# Patient Record
Sex: Female | Born: 2000 | Race: Black or African American | Hispanic: No | Marital: Single | Smoking: Never smoker
Health system: Southern US, Community
[De-identification: ages and names within clinical notes are randomized; demographics above are authoritative.]

## PROBLEM LIST (undated history)

## (undated) DIAGNOSIS — E282 Polycystic ovarian syndrome: Secondary | ICD-10-CM

---
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
## 2018-12-24 IMAGING — US US ABDOMEN LIMITED
1 series · 14 of 25 positions shown · non-contrast
Comparison: None.

CLINICAL DATA: Right upper quadrant pain

EXAM:
ULTRASOUND ABDOMEN LIMITED RIGHT UPPER QUADRANT

[Series 1: us abdomen limited · 0.13mm/px · 14 of 41 slices shown]
[im 1/41]
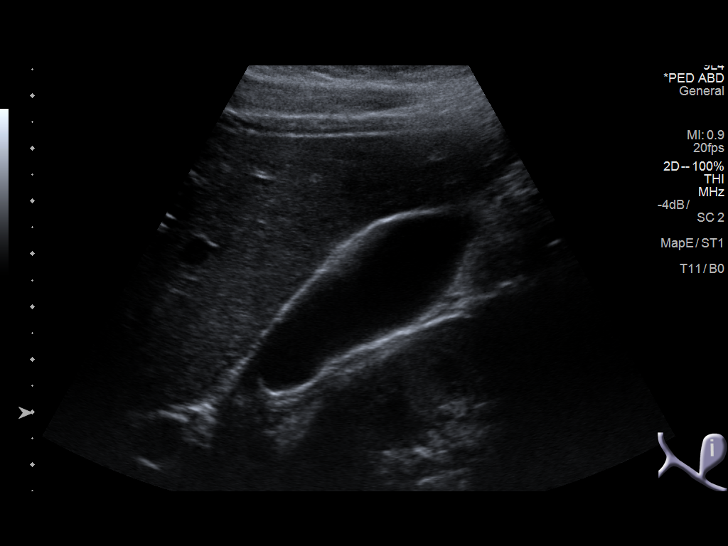
[im 4/41]
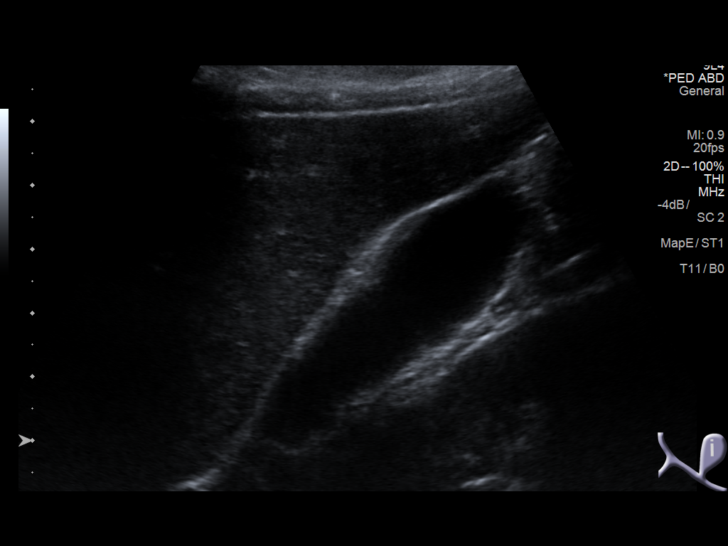
[im 7/41]
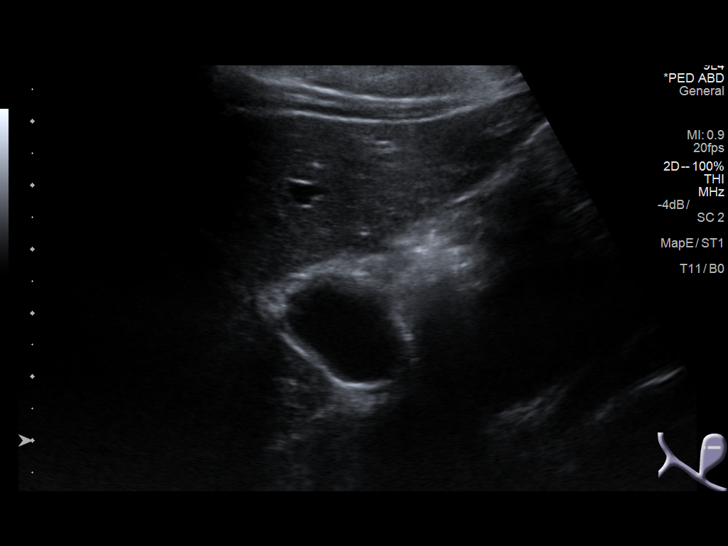
[im 11/41]
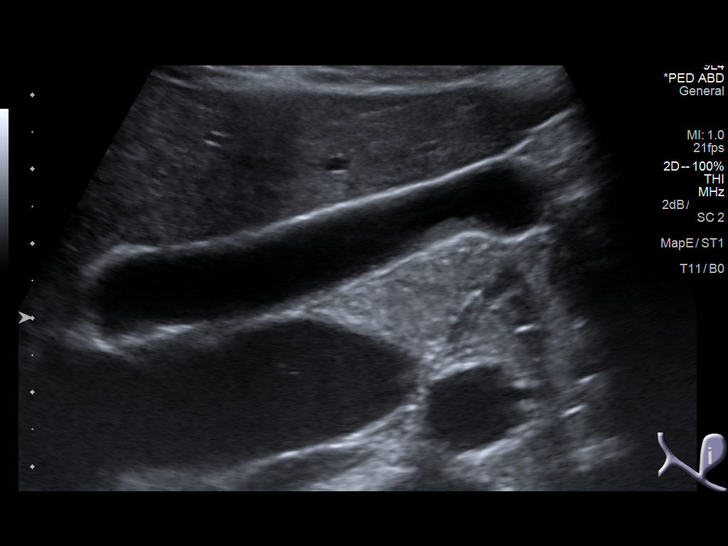
[im 14/41]
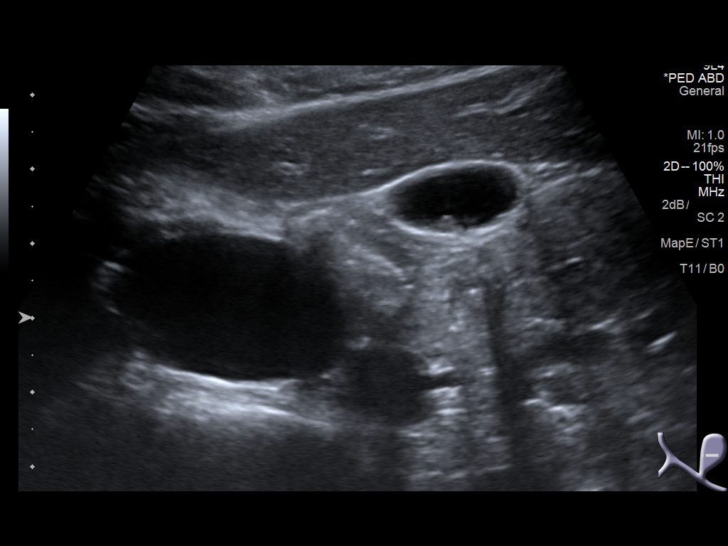
[im 16/41]
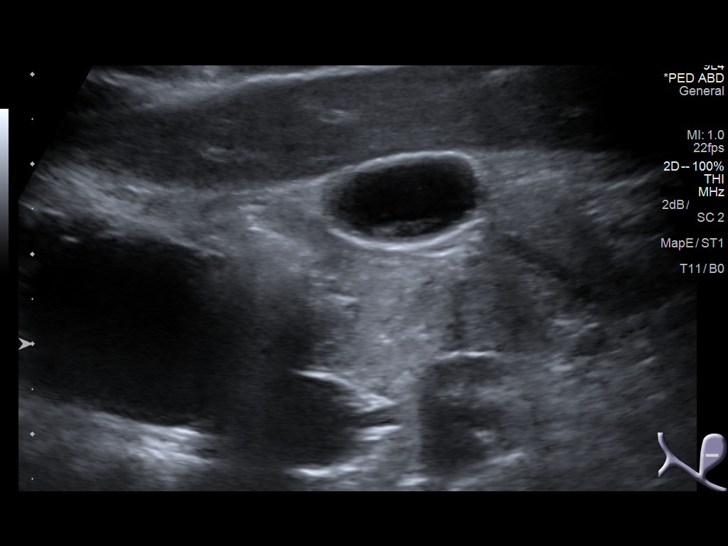
[im 19/41]
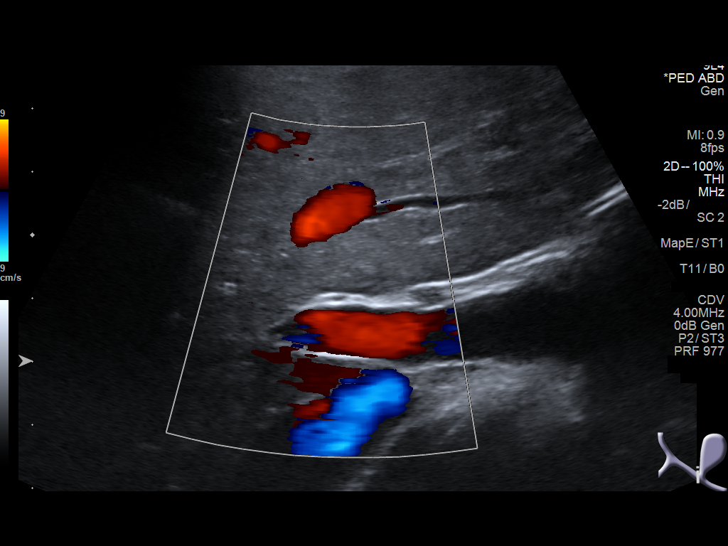
[im 22/41]
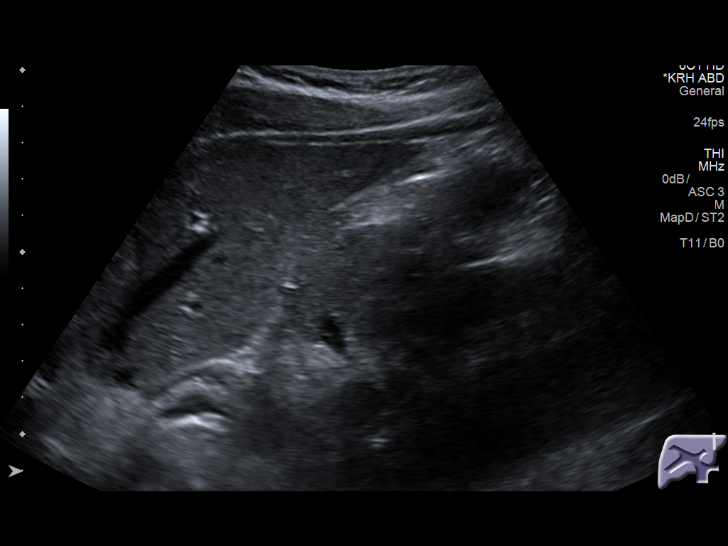
[im 26/41]
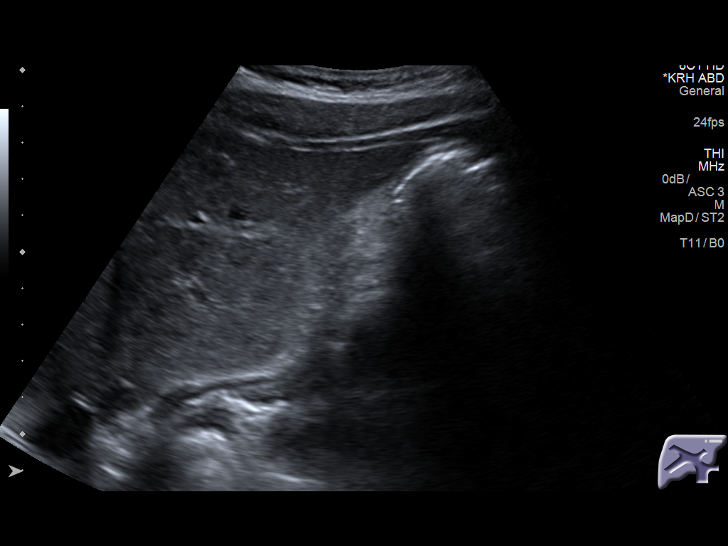
[im 27/41]
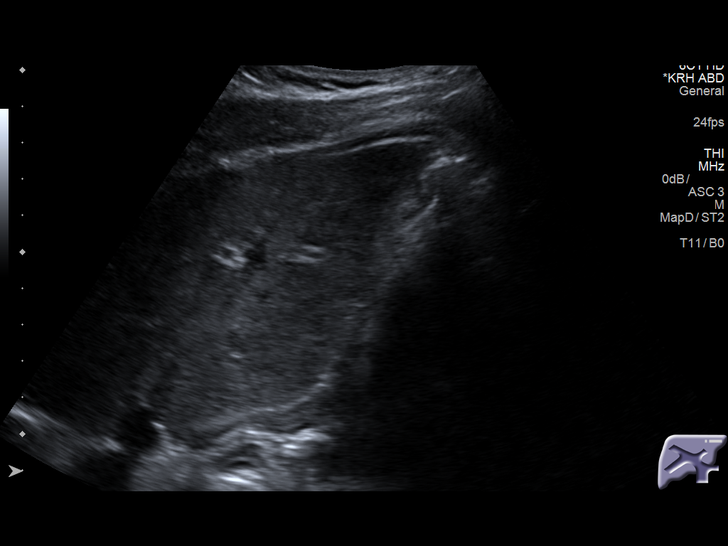
[im 31/41]
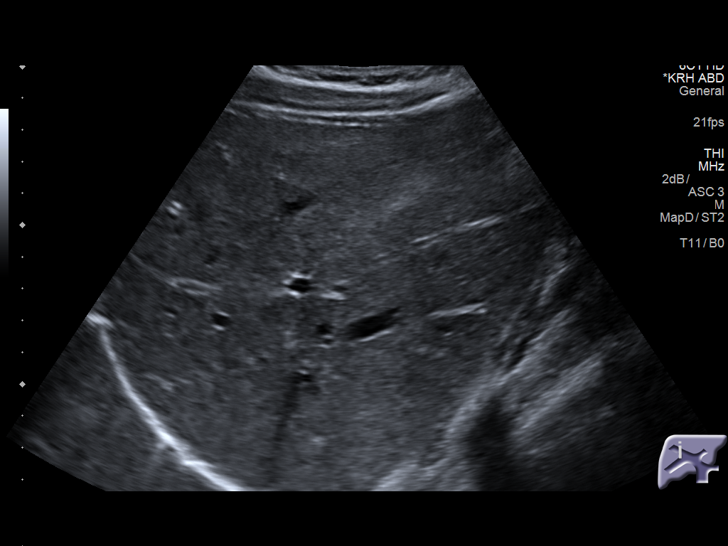
[im 34/41]
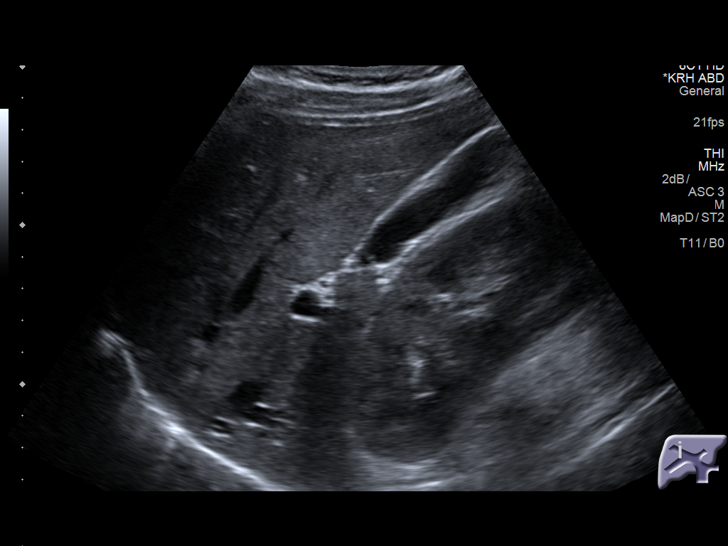
[im 37/41]
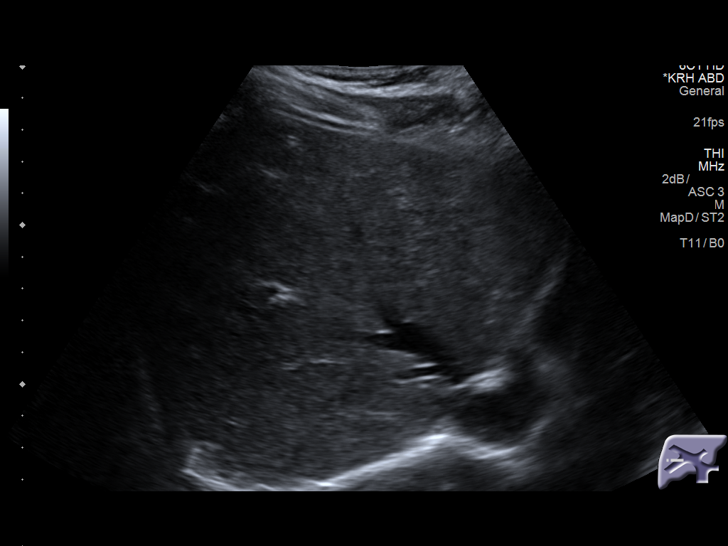
[im 41/41]
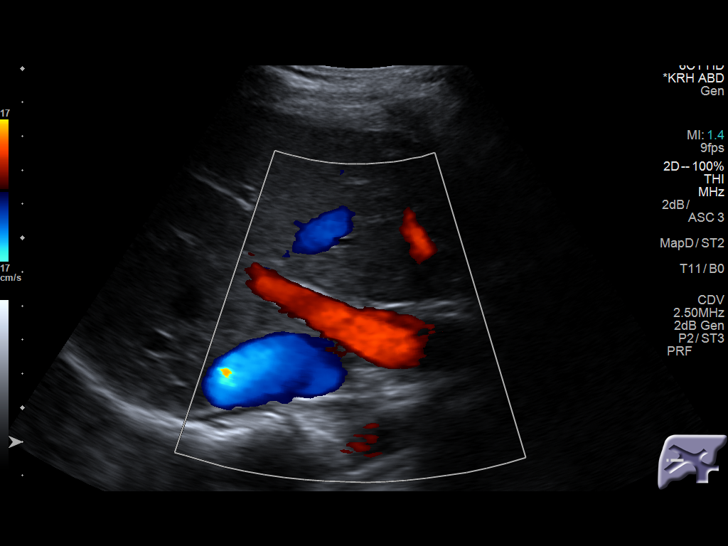

[14 of 25 positions shown; findings below may reference images not displayed]

FINDINGS: Gallbladder:

Small amount sludge in the gallbladder. No visible stones. There is
an area of focal wall thickening anteriorly measuring up to 3.7 mm.
Remainder of the gallbladder wall is normal. No pericholecystic
fluid.

Common bile duct:

Diameter: Normal caliber, 1 mm

Liver:

No focal lesion identified. Within normal limits in parenchymal
echogenicity.
IMPRESSION: Small amount of sludge in the gallbladder with small focal area of
slight gallbladder wall thickening. Remainder of the gallbladder
wall is normal. No visible stones.

## 2021-01-25 ENCOUNTER — Encounter (HOSPITAL_BASED_OUTPATIENT_CLINIC_OR_DEPARTMENT_OTHER): Payer: Self-pay | Admitting: *Deleted

## 2021-01-25 ENCOUNTER — Emergency Department (HOSPITAL_BASED_OUTPATIENT_CLINIC_OR_DEPARTMENT_OTHER)
Admission: EM | Admit: 2021-01-25 | Discharge: 2021-01-26 | Disposition: A | Discharge status: Home / Self Care | Payer: 59 | Attending: Emergency Medicine | Admitting: Emergency Medicine

## 2021-01-25 ENCOUNTER — Other Ambulatory Visit: Payer: Self-pay

## 2021-01-25 DIAGNOSIS — R1110 Vomiting, unspecified: Secondary | ICD-10-CM | POA: Diagnosis present

## 2021-01-25 DIAGNOSIS — F1099 Alcohol use, unspecified with unspecified alcohol-induced disorder: Secondary | ICD-10-CM | POA: Diagnosis not present

## 2021-01-25 DIAGNOSIS — E876 Hypokalemia: Secondary | ICD-10-CM

## 2021-01-25 DIAGNOSIS — R112 Nausea with vomiting, unspecified: Secondary | ICD-10-CM

## 2021-01-25 MED ORDER — ONDANSETRON HCL 4 MG/2ML IJ SOLN
4.0000 mg | Freq: Once | INTRAMUSCULAR | Status: AC
  Administered 2021-01-26: 4 mg via INTRAVENOUS
  Filled 2021-01-25: qty 2

## 2021-01-25 MED ORDER — SODIUM CHLORIDE 0.9 % IV BOLUS
500.0000 mL | Freq: Once | INTRAVENOUS | Status: AC
  Administered 2021-01-26: 500 mL via INTRAVENOUS

## 2021-01-25 NOTE — ED Triage Notes (Signed)
Pt reports n/v x 5 today. Denies diarrhea. States she did go to eat out yesterday and had some drinks

## 2021-01-26 ENCOUNTER — Encounter (HOSPITAL_BASED_OUTPATIENT_CLINIC_OR_DEPARTMENT_OTHER): Payer: Self-pay | Admitting: Emergency Medicine

## 2021-01-26 DIAGNOSIS — R1110 Vomiting, unspecified: Secondary | ICD-10-CM | POA: Diagnosis not present

## 2021-01-26 LAB — BASIC METABOLIC PANEL
Anion gap: 11 (ref 5–15)
BUN: 10 mg/dL (ref 6–20)
CO2: 22 mmol/L (ref 22–32)
Calcium: 9.2 mg/dL (ref 8.9–10.3)
Chloride: 105 mmol/L (ref 98–111)
Creatinine, Ser: 0.77 mg/dL (ref 0.44–1.00)
GFR, Estimated: >60 mL/min (ref >60)
Glucose, Bld: 92 mg/dL (ref 70–99)
Potassium: 3.1 mmol/L — ABNORMAL LOW (ref 3.5–5.1)
Sodium: 138 mmol/L (ref 135–145)

## 2021-01-26 LAB — PREGNANCY, URINE: Preg Test, Ur: NEGATIVE

## 2021-01-26 MED ORDER — ONDANSETRON 8 MG PO TBDP: ORAL_TABLET | ORAL | 0 refills | Status: DC

## 2021-01-26 MED ORDER — POTASSIUM CHLORIDE CRYS ER 20 MEQ PO TBCR
40.0000 meq | EXTENDED_RELEASE_TABLET | Freq: Once | ORAL | Status: AC
  Administered 2021-01-26: 40 meq via ORAL
  Filled 2021-01-26: qty 2

## 2021-01-26 NOTE — ED Provider Notes (Signed)
[facility] EMERGENCY DEPARTMENT Provider Note   CSN: [id number] Arrival date & time: 01/25/21  2313     History Chief Complaint  Patient presents with   Emesis    Candace Gonzales is a 20 y.o. female.  The history is provided by the patient.  Emesis Severity:  Mild Duration:  8 hours Timing:  Intermittent Quality:  Stomach contents Progression:  Unchanged Chronicity:  New Recent urination:  Normal Context: not post-tussive   Relieved by:  Nothing Worsened by:  Nothing Ineffective treatments:  None tried Associated symptoms: no abdominal pain, no arthralgias, no chills, no cough, no diarrhea, no fever, no headaches, no myalgias, no sore throat and no URI   Risk factors: alcohol use   Risk factors: no prior abdominal surgery       History reviewed. No pertinent past medical history.  There are no problems to display for this patient.   History reviewed. No pertinent surgical history.   OB History   No obstetric history on file.     History reviewed. No pertinent family history.  Social History   Tobacco Use   Smoking status: Never   Smokeless tobacco: Never  Vaping Use   Vaping Use: Never used  Substance Use Topics   Alcohol use: Yes   Drug use: No    Home Medications Prior to Admission medications   Medication Sig Start Date End Date Taking? Authorizing Provider  amoxicillin-clavulanate (AUGMENTIN) 875-125 MG tablet Take 1 tablet by mouth every 12 (twelve) hours. 04/15/17   Mackuen, Courteney Lyn, MD  cephALEXin (KEFLEX) 500 MG capsule Take 1 capsule (500 mg total) by mouth 2 (two) times daily. 07/22/17   Maczis, Elmer Sow, PA-C  hydrocortisone valerate cream (WESTCORT) 0.2 % Apply 1 application topically 2 (two) times daily as needed. For eczema    [provider]  metroNIDAZOLE (FLAGYL) 500 MG tablet Take 1 tablet (500 mg total) by mouth 2 (two) times daily. 07/22/17   Maczis, Elmer Sow, PA-C  phenazopyridine (PYRIDIUM) 95 MG tablet  Take 1 tablet (95 mg total) by mouth 3 (three) times daily as needed for pain. 07/22/17   Maczis, Elmer Sow, PA-C  UNKNOWN TO PATIENT     [provider]    Allergies    Patient has no known allergies.  Review of Systems   Review of Systems  Constitutional:  Negative for chills and fever.  HENT:  Negative for sore throat.   Eyes:  Negative for redness.  Respiratory:  Negative for cough.   Gastrointestinal:  Positive for vomiting. Negative for abdominal pain and diarrhea.  Genitourinary:  Negative for difficulty urinating.  Musculoskeletal:  Negative for arthralgias and myalgias.  Skin:  Negative for rash.  Neurological:  Negative for headaches.  Psychiatric/Behavioral:  Negative for agitation.   All other systems reviewed and are negative.  Physical Exam Updated Vital Signs BP 120/82 (BP Location: Right Arm)   Pulse 70   Temp 98.9 F (37.2 C) (Oral)   Resp 16   Ht 5' (1.524 m)   Wt 65.8 kg   LMP 12/26/2020   SpO2 99%   BMI 28.32 kg/m   Physical Exam Vitals and nursing note reviewed.  Constitutional:      General: She is not in acute distress.    Appearance: Normal appearance.  HENT:     Head: Normocephalic and atraumatic.     Nose: Nose normal.  Eyes:     Conjunctiva/sclera: Conjunctivae normal.     Pupils: Pupils  are equal, round, and reactive to light.  Cardiovascular:     Rate and Rhythm: Normal rate and regular rhythm.     Pulses: Normal pulses.     Heart sounds: Normal heart sounds.  Pulmonary:     Effort: Pulmonary effort is normal.     Breath sounds: Normal breath sounds.  Abdominal:     General: Abdomen is flat. Bowel sounds are normal.     Palpations: Abdomen is soft.     Tenderness: There is no abdominal tenderness. There is no guarding or rebound.  Musculoskeletal:        General: Normal range of motion.     Cervical back: Normal range of motion and neck supple.  Skin:    General: Skin is warm and dry.     Capillary Refill: Capillary  refill takes less than 2 seconds.  Neurological:     General: No focal deficit present.     Mental Status: She is alert and oriented to person, place, and time.     Deep Tendon Reflexes: Reflexes normal.  Psychiatric:        Mood and Affect: Mood normal.        Behavior: Behavior normal.    ED Results / Procedures / Treatments   Labs (all labs ordered are listed, but only abnormal results are displayed) Results for orders placed or performed during the hospital encounter of 01/25/21  Pregnancy, urine  Result Value Ref Range   Preg Test, Ur NEGATIVE NEGATIVE  Basic metabolic panel  Result Value Ref Range   Sodium 138 135 - 145 mmol/L   Potassium 3.1 (L) 3.5 - 5.1 mmol/L   Chloride 105 98 - 111 mmol/L   CO2 22 22 - 32 mmol/L   Glucose, Bld 92 70 - 99 mg/dL   BUN 10 6 - 20 mg/dL   Creatinine, Ser 0.92 0.44 - 1.00 mg/dL   Calcium 9.2 8.9 - 33.0 mg/dL   GFR, Estimated >07 >62 mL/min   Anion gap 11 5 - 15   No results found.   Radiology No results found.  Procedures Procedures   Medications Ordered in ED Medications  ondansetron (ZOFRAN) injection 4 mg (4 mg Intravenous Given 01/26/21 0059)  sodium chloride 0.9 % bolus 500 mL (500 mLs Intravenous New Bag/Given 01/26/21 0101)  potassium chloride SA (KLOR-CON) CR tablet 40 mEq (40 mEq Oral Given 01/26/21 0130)    ED Course  I have reviewed the triage vital signs and the nursing notes.  Pertinent labs & imaging results that were available during my care of the patient were reviewed by me and considered in my medical decision making (see chart for details).  Vomiting post ETOH ingestion.  Do not drink alcohol.  PO challenged successfully.  Stable for discharge with close follow up.  I do not see signs of surgical abdomen.   Aranda Bihm was evaluated in Emergency Department on 01/26/2021 for the symptoms described in the history of present illness. She was evaluated in the context of the global COVID-19 pandemic, which necessitated  consideration that the patient might be at risk for infection with the SARS-CoV-2 virus that causes COVID-19. Institutional protocols and algorithms that pertain to the evaluation of patients at risk for COVID-19 are in a state of rapid change based on information released by regulatory bodies including the CDC and federal and state organizations. These policies and algorithms were followed during the patient's care in the ED.  Final Clinical Impression(s) / ED Diagnoses Final diagnoses:  None  Return for intractable cough, coughing up blood, fevers > 100.4 unrelieved by medication, shortness of breath, intractable vomiting, chest pain, shortness of breath, weakness, numbness, changes in speech, facial asymmetry, abdominal pain, passing out, Inability to tolerate liquids or food, cough, altered mental status or any concerns. No signs of systemic illness or infection. The patient is nontoxic-appearing on exam and vital signs are within normal limits. I have reviewed the triage vital signs and the nursing notes. Pertinent labs & imaging results that were available during my care of the patient were reviewed by me and considered in my medical decision making (see chart for details). After history, exam, and medical workup I feel the patient has been appropriately medically screened and is safe for discharge home. Pertinent diagnoses were discussed with the patient. Patient was given return precautions.  Rx / DC Orders ED Discharge Orders     None        Oneta Sigman, MD 01/26/21 [phone]

## 2021-01-26 NOTE — ED Notes (Signed)
Water was given to the Pt.  

## 2021-03-03 ENCOUNTER — Emergency Department (HOSPITAL_BASED_OUTPATIENT_CLINIC_OR_DEPARTMENT_OTHER)
Admission: EM | Admit: 2021-03-03 | Discharge: 2021-03-03 | Disposition: A | Discharge status: Home / Self Care | Payer: 59 | Attending: Student | Admitting: Student

## 2021-03-03 ENCOUNTER — Other Ambulatory Visit: Payer: Self-pay

## 2021-03-03 ENCOUNTER — Encounter (HOSPITAL_BASED_OUTPATIENT_CLINIC_OR_DEPARTMENT_OTHER): Payer: Self-pay

## 2021-03-03 DIAGNOSIS — J069 Acute upper respiratory infection, unspecified: Secondary | ICD-10-CM | POA: Diagnosis not present

## 2021-03-03 DIAGNOSIS — Z20822 Contact with and (suspected) exposure to covid-19: Secondary | ICD-10-CM | POA: Diagnosis not present

## 2021-03-03 DIAGNOSIS — J029 Acute pharyngitis, unspecified: Secondary | ICD-10-CM | POA: Diagnosis present

## 2021-03-03 LAB — RESP PANEL BY RT-PCR (FLU A&B, COVID) ARPGX2
Influenza A by PCR: NEGATIVE
Influenza B by PCR: NEGATIVE
SARS Coronavirus 2 by RT PCR: NEGATIVE

## 2021-03-03 LAB — GROUP A STREP BY PCR: Group A Strep by PCR: NOT DETECTED

## 2021-03-03 NOTE — ED Provider Notes (Signed)
[facility] EMERGENCY DEPARTMENT Provider Note   CSN: [id number] Arrival date & time: 03/03/21  1104     History Chief Complaint  Patient presents with   URI    Candace Gonzales is a 20 y.o. female with no past medical history who presented today after waking up with bilateral swollen lymph nodes and a sore throat.  Patient has not been sick recently.  No known sick contacts.  No fever, chills, chest pain or difficulty breathing.  No complaints of ear pain.  No cough, congestion or runny nose.  Has not tested herself for COVID.   URI Presenting symptoms: sore throat   Presenting symptoms: no cough, no ear pain, no fatigue and no fever   Associated symptoms: no sinus pain       History reviewed. No pertinent past medical history.  There are no problems to display for this patient.   History reviewed. No pertinent surgical history.   OB History   No obstetric history on file.     No family history on file.  Social History   Tobacco Use   Smoking status: Never   Smokeless tobacco: Never  Vaping Use   Vaping Use: Never used  Substance Use Topics   Alcohol use: Yes    Comment: occ   Drug use: No    Home Medications Prior to Admission medications   Medication Sig Start Date End Date Taking? Authorizing Provider  amoxicillin-clavulanate (AUGMENTIN) 875-125 MG tablet Take 1 tablet by mouth every 12 (twelve) hours. 04/15/17   Mackuen, Courteney Lyn, MD  cephALEXin (KEFLEX) 500 MG capsule Take 1 capsule (500 mg total) by mouth 2 (two) times daily. 07/22/17   Maczis, Elmer Sow, PA-C  hydrocortisone valerate cream (WESTCORT) 0.2 % Apply 1 application topically 2 (two) times daily as needed. For eczema    [provider]  metroNIDAZOLE (FLAGYL) 500 MG tablet Take 1 tablet (500 mg total) by mouth 2 (two) times daily. 07/22/17   Maczis, Elmer Sow, PA-C  ondansetron (ZOFRAN ODT) 8 MG disintegrating tablet 8mg  ODT q8 hours prn nausea 01/26/21   Palumbo, April, MD   phenazopyridine (PYRIDIUM) 95 MG tablet Take 1 tablet (95 mg total) by mouth 3 (three) times daily as needed for pain. 07/22/17   Maczis, 07/24/17, PA-C  UNKNOWN TO PATIENT     [provider]    Allergies    Patient has no known allergies.  Review of Systems   Review of Systems  Constitutional:  Negative for chills, fatigue and fever.  HENT:  Positive for sore throat. Negative for ear discharge, ear pain and sinus pain.   Respiratory:  Negative for cough and shortness of breath.   Cardiovascular:  Negative for chest pain and palpitations.  Gastrointestinal:  Negative for diarrhea.  Skin:  Negative for rash.  All other systems reviewed and are negative.  Physical Exam Updated Vital Signs BP 123/84 (BP Location: Right Arm)   Pulse 98   Temp 98.6 F (37 C) (Oral)   Resp 16   Ht 5' (1.524 m)   Wt 65.1 kg   LMP 02/01/2021   SpO2 100%   BMI 28.03 kg/m   Physical Exam Vitals and nursing note reviewed.  Constitutional:      Appearance: Normal appearance.  HENT:     Head: Normocephalic and atraumatic.     Right Ear: Tympanic membrane, ear canal and external ear normal.     Left Ear: Tympanic membrane, ear canal and external ear  normal.     Nose: Nose normal.     Mouth/Throat:     Mouth: Mucous membranes are moist.     Pharynx: Oropharynx is clear. No oropharyngeal exudate or posterior oropharyngeal erythema.     Comments: 2+ tonsillar swelling Eyes:     General: No scleral icterus.    Conjunctiva/sclera: Conjunctivae normal.     Pupils: Pupils are equal, round, and reactive to light.  Cardiovascular:     Rate and Rhythm: Normal rate and regular rhythm.  Pulmonary:     Effort: Pulmonary effort is normal. No respiratory distress.     Breath sounds: Normal breath sounds. No wheezing or rales.  Musculoskeletal:     Cervical back: Normal range of motion.  Lymphadenopathy:     Cervical: No cervical adenopathy.  Skin:    General: Skin is warm and dry.      Findings: No rash.  Neurological:     Mental Status: She is alert.  Psychiatric:        Mood and Affect: Mood normal.    ED Results / Procedures / Treatments   Labs (all labs ordered are listed, but only abnormal results are displayed) Labs Reviewed  RESP PANEL BY RT-PCR (FLU A&B, COVID) ARPGX2  GROUP A STREP BY PCR    EKG None  Radiology No results found.  Procedures Procedures   Medications Ordered in ED Medications - No data to display  ED Course  I have reviewed the triage vital signs and the nursing notes.  Pertinent labs & imaging results that were available during my care of the patient were reviewed by me and considered in my medical decision making (see chart for details).    MDM Rules/Calculators/A&P   Priscille Heidelberg was evaluated by me and fast-track.  She did not have any lymphadenopathy or evidence of throat infection, PTA or retropharyngeal abscess.  Strep throat was negative.  COVID-19 and flu were also negative.  I suspect her symptoms to be due to a virus.  These symptoms only began this morning and she does not need treatment in our department at this time.  She may treat her illness with over-the-counter medication and follow-up if symptoms continue for over a 10 days.  Final Clinical Impression(s) / ED Diagnoses Final diagnoses:  Viral upper respiratory tract infection    Rx / DC Orders Results and diagnoses were explained to the patient. Return precautions discussed in full. Patient had no additional questions and expressed complete understanding.     Saddie Benders, PA-C 03/03/21 1306    Glendora Score, MD 03/03/21 [phone]

## 2021-03-03 NOTE — Discharge Instructions (Addendum)
Your strep throat, COVID and flu testing is negative today. Because your symptoms started this morning I believe you need to give this illness more time.  You may utilize over-the-counter remedies for your symptoms.  Follow-up with your primary care provider if symptoms are still present for the next 10 days.  It was a pleasure to meet you and I hope that you feel better.

## 2021-03-03 NOTE — ED Triage Notes (Signed)
Pt c/o URI sx x today-NAD-steady gait

## 2021-05-26 ENCOUNTER — Emergency Department (INDEPENDENT_AMBULATORY_CARE_PROVIDER_SITE_OTHER)
Admission: EM | Admit: 2021-05-26 | Discharge: 2021-05-26 | Disposition: A | Discharge status: Home / Self Care | Payer: BLUE CROSS/BLUE SHIELD | Source: Home / Self Care | Attending: Family Medicine | Admitting: Family Medicine

## 2021-05-26 ENCOUNTER — Other Ambulatory Visit: Payer: Self-pay

## 2021-05-26 DIAGNOSIS — U071 COVID-19: Secondary | ICD-10-CM | POA: Diagnosis not present

## 2021-05-26 DIAGNOSIS — J029 Acute pharyngitis, unspecified: Secondary | ICD-10-CM

## 2021-05-26 LAB — POCT INFLUENZA A/B
Influenza A, POC: NEGATIVE
Influenza B, POC: NEGATIVE

## 2021-05-26 LAB — POCT RAPID STREP A (OFFICE): Rapid Strep A Screen: NEGATIVE

## 2021-05-26 LAB — POC SARS CORONAVIRUS 2 AG -  ED: SARS Coronavirus 2 Ag: POSITIVE — AB

## 2021-05-26 NOTE — ED Provider Notes (Signed)
[facility]    CSN: [id number] Arrival date & time: 05/26/21  1642      History   Chief Complaint Chief Complaint  Patient presents with   Sore Throat   Nasal Congestion    HPI Candace Gonzales is a 21 y.o. female.   HPI  Patient works at one of the [employer].  She called health at work today because she developed sore throat, cough, body aches and fatigue.  Symptoms started late yesterday and are worse today.  She states she has taken Tylenol.  Her temperature is 99.7.  Health at work told her to come here for viral testing prior to work tomorrow.  No known exposure to illness  History reviewed. No pertinent past medical history.  There are no problems to display for this patient.   History reviewed. No pertinent surgical history.  OB History   No obstetric history on file.      Home Medications    Prior to Admission medications   Not on File    Family History History reviewed. No pertinent family history.  Social History Social History   Tobacco Use   Smoking status: Never   Smokeless tobacco: Never  Vaping Use   Vaping Use: Never used  Substance Use Topics   Alcohol use: Yes    Comment: occ   Drug use: No     Allergies   Patient has no known allergies.   Review of Systems Review of Systems See HPI  Physical Exam Triage Vital Signs ED Triage Vitals  Enc Vitals Group     BP 05/26/21 1727 121/86     Pulse Rate 05/26/21 1727 95     Resp 05/26/21 1727 14     Temp 05/26/21 1727 99.7 F (37.6 C)     Temp src --      SpO2 05/26/21 1727 99 %     Weight --      Height --      Head Circumference --      Peak Flow --      Pain Score 05/26/21 1728 0     Pain Loc --      Pain Edu? --      Excl. in GC? --    No data found.  Updated Vital Signs BP 121/86 (BP Location: Left Arm)    Pulse 95    Temp 99.7 F (37.6 C)    Resp 14    LMP 05/14/2021 (Exact Date)    SpO2 99%      Physical Exam   UC Treatments /  Results  Labs (all labs ordered are listed, but only abnormal results are displayed) Labs Reviewed  POC SARS CORONAVIRUS 2 AG -  ED - Abnormal; Notable for the following components:      Result Value   SARS Coronavirus 2 Ag Positive (*)    All other components within normal limits  POCT INFLUENZA A/B  POCT RAPID STREP A (OFFICE)    EKG   Radiology No results found.  Procedures Procedures (including critical care time)  Medications Ordered in UC Medications - No data to display  Initial Impression / Assessment and Plan / UC Course  I have reviewed the triage vital signs and the nursing notes.  Pertinent labs & imaging results that were available during my care of the patient were reviewed by me and considered in my medical decision making (see chart for details).     COVID test is  positive Final Clinical Impressions(s) / UC Diagnoses   Final diagnoses:  Sore throat  COVID-19     Discharge Instructions      May take Tylenol or ibuprofen for pain and fever May take over-the-counter cough and cold medications You must quarantine at home for 5 days Check with health at work for return to work status Call for problems     ED Prescriptions   None    PDMP not reviewed this encounter.   Eustace Moore, MD 05/26/21 [phone]

## 2021-05-26 NOTE — Discharge Instructions (Addendum)
May take Tylenol or ibuprofen for pain and fever May take over-the-counter cough and cold medications You must quarantine at home for 5 days Check with health at work for return to work status Call for problems

## 2021-05-26 NOTE — ED Triage Notes (Signed)
Pt presents with nasal congestion and sore throat. Pt denies any cough, body aches, or fever. Sx began yesterday.

## 2021-09-16 ENCOUNTER — Emergency Department (INDEPENDENT_AMBULATORY_CARE_PROVIDER_SITE_OTHER)
Admission: EM | Admit: 2021-09-16 | Discharge: 2021-09-16 | Disposition: A | Discharge status: Home / Self Care | Payer: BLUE CROSS/BLUE SHIELD | Source: Home / Self Care | Attending: Family Medicine | Admitting: Family Medicine

## 2021-09-16 ENCOUNTER — Inpatient Hospital Stay (HOSPITAL_COMMUNITY): Admit: 2021-09-16 | Payer: BLUE CROSS/BLUE SHIELD

## 2021-09-16 DIAGNOSIS — R350 Frequency of micturition: Secondary | ICD-10-CM | POA: Diagnosis not present

## 2021-09-16 DIAGNOSIS — N898 Other specified noninflammatory disorders of vagina: Secondary | ICD-10-CM | POA: Diagnosis not present

## 2021-09-16 DIAGNOSIS — R110 Nausea: Secondary | ICD-10-CM | POA: Diagnosis not present

## 2021-09-16 LAB — POCT URINALYSIS DIP (MANUAL ENTRY)
Glucose, UA: NEGATIVE mg/dL
Nitrite, UA: NEGATIVE
Protein Ur, POC: 30 mg/dL — AB
Spec Grav, UA: 1.02 (ref 1.010–1.025)
Urobilinogen, UA: 1 E.U./dL
pH, UA: 6.5 (ref 5.0–8.0)

## 2021-09-16 LAB — POC SARS CORONAVIRUS 2 AG -  ED: SARS Coronavirus 2 Ag: NEGATIVE

## 2021-09-16 LAB — POCT URINE PREGNANCY: Preg Test, Ur: NEGATIVE

## 2021-09-16 MED ORDER — ONDANSETRON 4 MG PO TBDP: 4.0000 mg | ORAL_TABLET | Freq: Three times a day (TID) | ORAL | 0 refills | Status: DC | PRN

## 2021-09-16 MED ORDER — ONDANSETRON 4 MG PO TBDP
4.0000 mg | ORAL_TABLET | Freq: Once | ORAL | Status: AC
  Administered 2021-09-16: 4 mg via ORAL

## 2021-09-16 MED ORDER — NITROFURANTOIN MONOHYD MACRO 100 MG PO CAPS: ORAL_CAPSULE | ORAL | 0 refills | Status: DC

## 2021-09-16 NOTE — Discharge Instructions (Addendum)
Increase fluid intake. ? ?If symptoms become significantly worse during the night or over the weekend, proceed to the local emergency room.  ?Avoid all sexual contact until your symptoms have cleared and your tests are negative (or treated if positive). ?

## 2021-09-16 NOTE — ED Triage Notes (Signed)
Pt c/o nausea and headache since Sunday. Taking OTC headache meds prn. Also c/o vaginal discharge that started yesterday. Hx of BV. ?

## 2021-09-16 NOTE — ED Provider Notes (Signed)
?[facility] ? ? ? ?CSN: [id number] ?Arrival date & time: 09/16/21  1314 ? ? ?  ? ?History   ?Chief Complaint ?Chief Complaint  ?Patient presents with  ? Vaginal Discharge  ? Nausea  ? Headache  ? ? ?HPI ?Candace Gonzales is a 21 y.o. female.  ? ?Two days ago patient developed headache, nausea (without vomiting) and diarrhea (now resolved).  Yesterday she noticed abdominal bloating, and developed urinary frequency and clear vaginal discharge.  She denies pelvic pain and notes that she has a history of recurring BV.  Patient's last menstrual period was 09/06/2021 (exact date).  ? ?The history is provided by the patient.  ? ?History reviewed. No pertinent past medical history. ? ?There are no problems to display for this patient. ? ? ?History reviewed. No pertinent surgical history. ? ?OB History   ?No obstetric history on file. ?  ? ? ? ?Home Medications   ? ?Prior to Admission medications   ?Medication Sig Start Date End Date Taking? Authorizing Provider  ?escitalopram (LEXAPRO) 10 MG tablet Take 1 tablet by mouth daily. 08/05/21  Yes [provider]  ?nitrofurantoin, macrocrystal-monohydrate, (MACROBID) 100 MG capsule Take one cap PO Q12hr with food. 09/16/21  Yes Lattie Haw, MD  ?ondansetron (ZOFRAN-ODT) 4 MG disintegrating tablet Take 1 tablet (4 mg total) by mouth every 8 (eight) hours as needed for nausea or vomiting. Dissolve under tongue. 09/16/21  Yes Lattie Haw, MD  ?norgestimate-ethinyl estradiol (ORTHO-CYCLEN) 0.25-35 MG-MCG tablet Take 1 tablet by mouth daily. 08/29/21   [provider]  ? ? ?Family History ?History reviewed. No pertinent family history. ? ?Social History ?Social History  ? ?Tobacco Use  ? Smoking status: Never  ? Smokeless tobacco: Never  ?Vaping Use  ? Vaping Use: Never used  ?Substance Use Topics  ? Alcohol use: Yes  ?  Comment: occ  ? Drug use: No  ? ? ? ?Allergies   ?Patient has no known allergies. ? ? ?Review of Systems ?Review of Systems   ?Constitutional:  Positive for activity change, appetite change and fatigue. Negative for chills, diaphoresis and fever.  ?HENT: Negative.    ?Eyes: Negative.   ?Respiratory: Negative.    ?Cardiovascular: Negative.   ?Gastrointestinal:  Positive for diarrhea and nausea. Negative for abdominal distention, abdominal pain, blood in stool, constipation and vomiting.  ?Genitourinary:  Positive for frequency and vaginal discharge. Negative for dysuria, flank pain, genital sores, hematuria, menstrual problem, pelvic pain, urgency, vaginal bleeding and vaginal pain.  ?Musculoskeletal: Negative.   ?Skin: Negative.   ?Neurological:  Positive for headaches.  ?Hematological:  Negative for adenopathy.  ? ? ?Physical Exam ?Triage Vital Signs ?ED Triage Vitals  ?Enc Vitals Group  ?   BP 09/16/21 1344 122/79  ?   Pulse Rate 09/16/21 1344 (!) 107  ?   Resp 09/16/21 1344 18  ?   Temp 09/16/21 1344 (!) 100.5 ?F (38.1 ?C)  ?   Temp Source 09/16/21 1344 Oral  ?   SpO2 09/16/21 1344 98 %  ?   Weight --   ?   Height --   ?   Head Circumference --   ?   Peak Flow --   ?   Pain Score 09/16/21 1345 0  ?   Pain Loc --   ?   Pain Edu? --   ?   Excl. in GC? --   ? ?No data found. ? ?Updated Vital Signs ?BP 122/79 (BP Location: Right Arm)  Pulse (!) 107   Temp (!) 100.5 ?F (38.1 ?C) (Oral)   Resp 18   LMP 09/06/2021 (Exact Date)   SpO2 98%  ? ?Visual Acuity ?Right Eye Distance:   ?Left Eye Distance:   ?Bilateral Distance:   ? ?Right Eye Near:   ?Left Eye Near:    ?Bilateral Near:    ? ?Physical Exam ?Nursing notes and Vital Signs reviewed. ?Appearance:  Patient appears stated age, and in no acute distress ?Eyes:  Pupils are equal, round, and reactive to light and accomodation.  Extraocular movement is intact.  Conjunctivae are not inflamed  ?Ears:  Canals normal.  Tympanic membranes normal.  ?Nose:  Normal turbinates.  No sinus tenderness.   ?Pharynx:  Normal; moist mucous membranes  ?Neck:  Supple.  No adenopathy ?Lungs:  Clear to  auscultation.  Breath sounds are equal.  Moving air well. ?Heart:  Regular rate and rhythm without murmurs, rubs, or gallops.  Tachycardia 107. ?Abdomen:  Nontender without masses or hepatosplenomegaly.  Bowel sounds are present.  No CVA or flank tenderness.  ?Extremities:  No edema.  ?Pelvic exam:  deferred (patient instructed in self-collecting vaginal specimen) ?Skin:  No rash present. ? ? ?UC Treatments / Results  ?Labs ?(all labs ordered are listed, but only abnormal results are displayed) ?Labs Reviewed  ?POCT URINALYSIS DIP (MANUAL ENTRY) - Abnormal; Notable for the following components:  ?    Result Value  ? Bilirubin, UA small (*)   ? Ketones, POC UA large (80) (*)   ? Blood, UA small (*)   ? Protein Ur, POC =30 (*)   ? Leukocytes, UA Trace (*)   ? All other components within normal limits  ?URINE CULTURE  ?POC SARS CORONAVIRUS 2 AG -  ED  ?POCT URINE PREGNANCY  ? ? ?EKG ? ? ?Radiology ?No results found. ? ?Procedures ?Procedures (including critical care time) ? ?Medications Ordered in UC ?Medications  ?ondansetron (ZOFRAN-ODT) disintegrating tablet 4 mg (4 mg Oral Given 09/16/21 1359)  ? ? ?Initial Impression / Assessment and Plan / UC Course  ?I have reviewed the triage vital signs and the nursing notes. ? ?Pertinent labs & imaging results that were available during my care of the patient were reviewed by me and considered in my medical decision making (see chart for details). ? ?  ?Tests pending as above. ?Begin empiric Macrobid.  Administered Zofran ODT 4mg  PO. Rx for Zofran ODT 4mg . ?Followup with Family Doctor if not improved in one week.  ? ?Final Clinical Impressions(s) / UC Diagnoses  ? ?Final diagnoses:  ?Increased urinary frequency  ?Vaginal discharge  ?Nausea without vomiting  ? ? ? ?Discharge Instructions   ? ?  ?Increase fluid intake. ? ?If symptoms become significantly worse during the night or over the weekend, proceed to the local emergency room.  ?Avoid all sexual contact until your  symptoms have cleared and your tests are negative (or treated if positive). ? ? ? ?ED Prescriptions   ? ? Medication Sig Dispense Auth. Provider  ? nitrofurantoin, macrocrystal-monohydrate, (MACROBID) 100 MG capsule Take one cap PO Q12hr with food. 14 capsule , MD  ? ondansetron (ZOFRAN-ODT) 4 MG disintegrating tablet Take 1 tablet (4 mg total) by mouth every 8 (eight) hours as needed for nausea or vomiting. Dissolve under tongue. 12 tablet , MD  ? ?  ? ? ?  ?Lattie Haw, MD ?09/18/21 1045 ? ?

## 2021-09-17 LAB — CERVICOVAGINAL ANCILLARY ONLY
Bacterial Vaginitis (gardnerella): NEGATIVE
Candida Glabrata: NEGATIVE
Candida Vaginitis: NEGATIVE
Chlamydia: NEGATIVE
Comment: NEGATIVE
Comment: NEGATIVE
Comment: NEGATIVE
Comment: NEGATIVE
Comment: NEGATIVE
Comment: NORMAL
Neisseria Gonorrhea: NEGATIVE
Trichomonas: NEGATIVE

## 2021-09-18 LAB — URINE CULTURE
MICRO NUMBER:: 13313111
Result:: NO GROWTH
SPECIMEN QUALITY:: ADEQUATE

## 2022-12-17 ENCOUNTER — Ambulatory Visit: Payer: Self-pay

## 2022-12-17 ENCOUNTER — Ambulatory Visit
Admission: RE | Admit: 2022-12-17 | Discharge: 2022-12-17 | Disposition: A | Discharge status: Home / Self Care | Payer: BLUE CROSS/BLUE SHIELD | Source: Ambulatory Visit | Attending: Family Medicine | Admitting: Family Medicine

## 2022-12-17 ENCOUNTER — Other Ambulatory Visit: Payer: Self-pay

## 2022-12-17 VITALS — BP 125/82 | HR 81 | Temp 99.1°F | Resp 16

## 2022-12-17 DIAGNOSIS — N898 Other specified noninflammatory disorders of vagina: Secondary | ICD-10-CM | POA: Insufficient documentation

## 2022-12-17 MED ORDER — FLUCONAZOLE 150 MG PO TABS: 150.0000 mg | ORAL_TABLET | Freq: Every day | ORAL | 0 refills | Status: DC

## 2022-12-17 NOTE — ED Provider Notes (Signed)
[facility]    CSN: [id number] Arrival date & time: 12/17/22  1334      History   Chief Complaint Chief Complaint  Patient presents with   Vaginal Discharge    Entered by patient    HPI Candace Gonzales is a 22 y.o. female.   HPI  Patient had a GYN checkup last week.  She had a Pap smear.  She had a full STD panel including blood work.  Everything was negative.  Pap showed ASCUS Since that time she has developed vaginal itching.  She states "it smells yeasty" denies need for any additional STD testing  History reviewed. No pertinent past medical history.  There are no problems to display for this patient.   History reviewed. No pertinent surgical history.  OB History   No obstetric history on file.      Home Medications    Prior to Admission medications   Medication Sig Start Date End Date Taking? Authorizing Provider  fluconazole (DIFLUCAN) 150 MG tablet Take 1 tablet (150 mg total) by mouth daily. Repeat in 1 week if needed 12/17/22  Yes Eustace Moore, MD    Family History History reviewed. No pertinent family history.  Social History Social History   Tobacco Use   Smoking status: Never   Smokeless tobacco: Never  Vaping Use   Vaping status: Never Used  Substance Use Topics   Alcohol use: Yes    Comment: occ   Drug use: No     Allergies   Patient has no known allergies.   Review of Systems Review of Systems  See HPI Physical Exam Triage Vital Signs ED Triage Vitals [12/17/22 1342]  Encounter Vitals Group     BP 125/82     Systolic BP Percentile      Diastolic BP Percentile      Pulse Rate 81     Resp 16     Temp 99.1 F (37.3 C)     Temp Source Oral     SpO2 97 %     Weight      Height      Head Circumference      Peak Flow      Pain Score 4     Pain Loc      Pain Education      Exclude from Growth Chart    No data found.  Updated Vital Signs BP 125/82 (BP Location: Right Arm)   Pulse 81   Temp 99.1 F  (37.3 C) (Oral)   Resp 16   SpO2 97%       Physical Exam Constitutional:      General: She is not in acute distress.    Appearance: She is well-developed.  HENT:     Head: Normocephalic and atraumatic.  Eyes:     Conjunctiva/sclera: Conjunctivae normal.     Pupils: Pupils are equal, round, and reactive to light.  Cardiovascular:     Rate and Rhythm: Normal rate.  Pulmonary:     Effort: Pulmonary effort is normal. No respiratory distress.  Abdominal:     General: There is no distension.     Palpations: Abdomen is soft.  Musculoskeletal:        General: Normal range of motion.     Cervical back: Normal range of motion.  Skin:    General: Skin is warm and dry.  Neurological:     Mental Status: She is alert.      UC Treatments /  Results  Labs (all labs ordered are listed, but only abnormal results are displayed) Labs Reviewed  CERVICOVAGINAL ANCILLARY ONLY    EKG   Radiology No results found.  Procedures Procedures (including critical care time)  Medications Ordered in UC Medications - No data to display  Initial Impression / Assessment and Plan / UC Course  I have reviewed the triage vital signs and the nursing notes.  Pertinent labs & imaging results that were available during my care of the patient were reviewed by me and considered in my medical decision making (see chart for details).     A swab was sent for BV and yeast.  She will be called if any change in treatment is needed.  I am going to go ahead and order the Diflucan to fill and take if yeast is the culprit Final Clinical Impressions(s) / UC Diagnoses   Final diagnoses:  Vaginal discharge     Discharge Instructions      I am prescribing diflucan for yeast infection Check My Chart for test results A nurse will call you if any change in management is needed   ED Prescriptions     Medication Sig Dispense Auth. Provider   fluconazole (DIFLUCAN) 150 MG tablet Take 1 tablet (150 mg  total) by mouth daily. Repeat in 1 week if needed 2 tablet Eustace Moore, MD      PDMP not reviewed this encounter.   Eustace Moore, MD 12/17/22 1357

## 2022-12-17 NOTE — ED Triage Notes (Signed)
Reports had pap smear last week, since then noticed a slight odor. States some slight discharge (colorless). Denies wanting STD testing.

## 2022-12-17 NOTE — Discharge Instructions (Addendum)
I am prescribing diflucan for yeast infection Check My Chart for test results A nurse will call you if any change in management is needed

## 2022-12-18 LAB — CERVICOVAGINAL ANCILLARY ONLY: Comment: NEGATIVE

## 2023-01-07 ENCOUNTER — Encounter (HOSPITAL_BASED_OUTPATIENT_CLINIC_OR_DEPARTMENT_OTHER): Payer: Self-pay | Admitting: Emergency Medicine

## 2023-01-07 ENCOUNTER — Emergency Department (HOSPITAL_BASED_OUTPATIENT_CLINIC_OR_DEPARTMENT_OTHER)
Admission: EM | Admit: 2023-01-07 | Discharge: 2023-01-07 | Discharge status: Against Medical Advice | Payer: BLUE CROSS/BLUE SHIELD | Attending: Emergency Medicine | Admitting: Emergency Medicine

## 2023-01-07 ENCOUNTER — Other Ambulatory Visit: Payer: Self-pay

## 2023-01-07 DIAGNOSIS — Z5321 Procedure and treatment not carried out due to patient leaving prior to being seen by health care provider: Secondary | ICD-10-CM | POA: Insufficient documentation

## 2023-01-07 DIAGNOSIS — N898 Other specified noninflammatory disorders of vagina: Secondary | ICD-10-CM | POA: Insufficient documentation

## 2023-01-07 LAB — URINALYSIS, ROUTINE W REFLEX MICROSCOPIC
Bilirubin Urine: NEGATIVE
Glucose, UA: NEGATIVE mg/dL
Ketones, ur: NEGATIVE mg/dL
Leukocytes,Ua: NEGATIVE
Nitrite: NEGATIVE
Protein, ur: 30 mg/dL — AB
Specific Gravity, Urine: 1.02 (ref 1.005–1.030)
pH: 8.5 — ABNORMAL HIGH (ref 5.0–8.0)

## 2023-01-07 LAB — URINALYSIS, MICROSCOPIC (REFLEX)

## 2023-01-07 LAB — PREGNANCY, URINE: Preg Test, Ur: NEGATIVE

## 2023-01-07 NOTE — ED Triage Notes (Signed)
Pt having vaginal discharge with odor for one month.  Has been seen for same and tested negative but is continuing to have symptoms.  Pt states she is having some pelvic pain recently

## 2023-01-08 ENCOUNTER — Encounter (HOSPITAL_BASED_OUTPATIENT_CLINIC_OR_DEPARTMENT_OTHER): Payer: Self-pay

## 2023-01-08 ENCOUNTER — Emergency Department (HOSPITAL_BASED_OUTPATIENT_CLINIC_OR_DEPARTMENT_OTHER)
Admission: EM | Admit: 2023-01-08 | Discharge: 2023-01-08 | Disposition: A | Discharge status: Home / Self Care | Payer: BLUE CROSS/BLUE SHIELD | Attending: Emergency Medicine | Admitting: Emergency Medicine

## 2023-01-08 DIAGNOSIS — N898 Other specified noninflammatory disorders of vagina: Secondary | ICD-10-CM | POA: Insufficient documentation

## 2023-01-08 LAB — WET PREP, GENITAL
Clue Cells Wet Prep HPF POC: NONE SEEN
Sperm: NONE SEEN
Trich, Wet Prep: NONE SEEN
WBC, Wet Prep HPF POC: <10 (ref <10)
Yeast Wet Prep HPF POC: NONE SEEN

## 2023-01-08 LAB — URINALYSIS, MICROSCOPIC (REFLEX)

## 2023-01-08 LAB — URINALYSIS, ROUTINE W REFLEX MICROSCOPIC
Bilirubin Urine: NEGATIVE
Glucose, UA: NEGATIVE mg/dL
Ketones, ur: NEGATIVE mg/dL
Leukocytes,Ua: NEGATIVE
Nitrite: NEGATIVE
Protein, ur: NEGATIVE mg/dL
Specific Gravity, Urine: >=1.03 (ref 1.005–1.030)
pH: 5.5 (ref 5.0–8.0)

## 2023-01-08 LAB — PREGNANCY, URINE: Preg Test, Ur: NEGATIVE

## 2023-01-08 NOTE — ED Triage Notes (Signed)
Pt reports that she has had pelvic pain, drainage and an odor for about 3 weeks.

## 2023-01-08 NOTE — Discharge Instructions (Signed)
Your lab test today are reassuring.  Your gonorrhea and chlamydia swabs will not come back for a couple of days and will be available in the MyChart app.  If positive, you should get a call from someone at [facility] to advise you of the results and to return for treatment.

## 2023-01-08 NOTE — ED Provider Notes (Signed)
Emergency Department Provider Note   I have reviewed the triage vital signs and the nursing notes.   HISTORY  Chief Complaint Pelvic Pain   HPI Candace Gonzales is a 22 y.o. female presents emergency department for evaluation of vaginal discharge and irritation.  She denies specific pain but has some generalized lower abdominal/pelvic discomfort.  No severe, focal pain.  She notes a discharge as well.  She denies any recent unprotected sexual encounters or concern for STI.  She was tested for HIV in the past 30 days.  Notes it was negative.  No upper abdominal pain or vomiting.  No diarrhea.  No vaginal bleeding.  History reviewed. No pertinent past medical history.  Review of Systems  Constitutional: No fever/chills. Cardiovascular: Denies chest pain. Respiratory: Denies shortness of breath. Gastrointestinal: No abdominal pain.  No nausea, no vomiting.  No diarrhea.  No constipation. Genitourinary: Negative for dysuria. Positive vaginal discharge and irritation.  Musculoskeletal: Negative for back pain. Skin: Negative for rash. Neurological: Negative for headaches.   ____________________________________________   PHYSICAL EXAM:  VITAL SIGNS: ED Triage Vitals  Encounter Vitals Group     BP 01/08/23 0723 126/88     Pulse Rate 01/08/23 0723 68     Resp 01/08/23 0723 18     Temp 01/08/23 0723 98.2 F (36.8 C)     Temp Source 01/08/23 0723 Oral     SpO2 01/08/23 0723 100 %   Constitutional: Alert and oriented. Well appearing and in no acute distress. Eyes: Conjunctivae are normal.  Head: Atraumatic. Nose: No congestion/rhinnorhea. Mouth/Throat: Mucous membranes are moist.   Neck: No stridor.   Cardiovascular: Normal rate, regular rhythm. Good peripheral circulation. Grossly normal heart sounds.   Respiratory: Normal respiratory effort.  No retractions. Lungs CTAB. Gastrointestinal: Soft and nontender. No distention.  Genitourinary: Pelvic exam performed after  obtaining patient's consent and with nurse chaperone present.  Normal external genitalia.  Physiologic discharge.  No bleeding. Musculoskeletal: No lower extremity tenderness nor edema. No gross deformities of extremities. Neurologic:  Normal speech and language. No gross focal neurologic deficits are appreciated.  Skin:  Skin is warm, dry and intact. No rash noted.   ____________________________________________   LABS (all labs ordered are listed, but only abnormal results are displayed)  Labs Reviewed  URINALYSIS, ROUTINE W REFLEX MICROSCOPIC - Abnormal; Notable for the following components:      Result Value   Hgb urine dipstick MODERATE (*)    All other components within normal limits  URINALYSIS, MICROSCOPIC (REFLEX) - Abnormal; Notable for the following components:   Bacteria, UA FEW (*)    All other components within normal limits  WET PREP, GENITAL  PREGNANCY, URINE  GC/CHLAMYDIA PROBE AMP ([place]) NOT AT [facility]   ____________________________________________   PROCEDURES  Procedure(s) performed:   Procedures  None  ____________________________________________   INITIAL IMPRESSION / ASSESSMENT AND PLAN / ED COURSE  Pertinent labs & imaging results that were available during my care of the patient were reviewed by me and considered in my medical decision making (see chart for details).   This patient is Presenting for Evaluation of vaginal discharge, which does require a range of treatment options, and is a complaint that involves a high risk of morbidity and mortality.  The Differential Diagnoses includes but is not exclusive to ectopic pregnancy, ovarian cyst, ovarian torsion, acute appendicitis, urinary tract infection, endometriosis, bowel obstruction, hernia, colitis, renal colic, gastroenteritis, volvulus etc.   Clinical Laboratory Tests Ordered, included UA without obvious  infection. Wet prep negative. Gonorrhea/chlamydia pending.   Radiologic Tests:  Considered pelvic US but no focal tenderness on abdominal or pelvic exam. Defer imaging for now.   Medical Decision Making: Summary:  Marlin Canary vaginal irritation and discharge.  This fairly unremarkable.  Exam not consistent with PID.   Reevaluation with update and discussion with patient. Plan to hold on empiric treatment for STI for now. Patient has no recent high risk activity or specific concern in this area. She will follow the results in the MyChart app.   Patient's presentation is most consistent with acute presentation with potential threat to life or bodily function.   Disposition: discharge  ____________________________________________  FINAL CLINICAL IMPRESSION(S) / ED DIAGNOSES  Final diagnoses:  Vaginal discharge    Note:  This document was prepared using Dragon voice recognition software and may include unintentional dictation errors.  Alona Bene, MD, [facility]    Eleanora Guinyard, Arlyss Repress, MD 01/08/23 [phone]

## 2023-01-08 NOTE — ED Notes (Signed)
Pt in restroom at this time.

## 2023-01-11 LAB — GC/CHLAMYDIA PROBE AMP (~~LOC~~) NOT AT ARMC
Chlamydia: NEGATIVE
Comment: NEGATIVE
Comment: NORMAL
Neisseria Gonorrhea: NEGATIVE

## 2023-09-14 DIAGNOSIS — E559 Vitamin D deficiency, unspecified: Secondary | ICD-10-CM | POA: Diagnosis not present

## 2023-09-14 DIAGNOSIS — Z0000 Encounter for general adult medical examination without abnormal findings: Secondary | ICD-10-CM | POA: Diagnosis not present

## 2023-09-14 DIAGNOSIS — E7800 Pure hypercholesterolemia, unspecified: Secondary | ICD-10-CM | POA: Diagnosis not present

## 2023-09-14 DIAGNOSIS — R5383 Other fatigue: Secondary | ICD-10-CM | POA: Diagnosis not present

## 2023-09-14 DIAGNOSIS — E282 Polycystic ovarian syndrome: Secondary | ICD-10-CM | POA: Diagnosis not present

## 2023-09-14 DIAGNOSIS — Z13220 Encounter for screening for lipoid disorders: Secondary | ICD-10-CM | POA: Diagnosis not present

## 2023-09-14 DIAGNOSIS — L309 Dermatitis, unspecified: Secondary | ICD-10-CM | POA: Diagnosis not present

## 2023-09-14 DIAGNOSIS — E669 Obesity, unspecified: Secondary | ICD-10-CM | POA: Diagnosis not present

## 2023-10-25 ENCOUNTER — Ambulatory Visit

## 2023-10-26 ENCOUNTER — Ambulatory Visit
Admission: RE | Admit: 2023-10-26 | Discharge: 2023-10-26 | Disposition: A | Discharge status: Home / Self Care | Source: Ambulatory Visit | Attending: Emergency Medicine | Admitting: Emergency Medicine

## 2023-10-26 ENCOUNTER — Other Ambulatory Visit: Payer: Self-pay

## 2023-10-26 VITALS — BP 137/83 | HR 85 | Temp 99.1°F | Resp 16

## 2023-10-26 DIAGNOSIS — R350 Frequency of micturition: Secondary | ICD-10-CM | POA: Insufficient documentation

## 2023-10-26 LAB — POCT URINALYSIS DIP (MANUAL ENTRY)
Bilirubin, UA: NEGATIVE
Glucose, UA: NEGATIVE mg/dL
Ketones, POC UA: NEGATIVE mg/dL
Leukocytes, UA: NEGATIVE
Nitrite, UA: NEGATIVE
Protein Ur, POC: NEGATIVE mg/dL
Spec Grav, UA: 1.02 (ref 1.010–1.025)
Urobilinogen, UA: 0.2 U/dL
pH, UA: 7.5 (ref 5.0–8.0)

## 2023-10-26 LAB — POCT URINE PREGNANCY: Preg Test, Ur: NEGATIVE

## 2023-10-26 NOTE — Discharge Instructions (Signed)
 The urinalysis that we performed in the clinic today was abnormal.  Urine culture will be performed per our protocol.  The result of the urine culture will be available in the next 3 to 5 days and will be posted to your MyChart account.  If there is an abnormal finding, you will be contacted by phone and advised of further treatment recommendations, if any.  If you have not had complete resolution of your symptoms after completing any treatment that might be prescribed, please return to urgent care for repeat evaluation or follow-up with your primary care provider.  Repeat urinalysis and urine culture may be indicated for more directed therapy.   Thank you for visiting [facility] today.  We appreciate the opportunity to participate in your care.

## 2023-10-26 NOTE — ED Triage Notes (Signed)
 Patient states " I think I have a UTI " C/O  lower back pain and urinary frequency since Sunday. Denies fever or chills. Denies burning with urination.

## 2023-10-26 NOTE — ED Provider Notes (Signed)
 [facility]    CSN: [id number] Arrival date & time: 10/26/23  1812    HISTORY   Chief Complaint  Patient presents with   Urinary Frequency    Entered by patient   Dysuria   HPI Candace Gonzales is a pleasant, 23 y.o. female who presents to urgent care today. Patient complains of a 2-day history of increased frequency of urination and pain on both sides of her lower back, states the pain in her lower back feels better today.  Patient denies fever, chills, burning with urination, blood in her urine, urine malodor, sensation of incomplete emptying, increased urge to urinate, difficulty initiating urination, fever, body aches, chills, vaginal discharge, vulvovaginal irritation.  The history is provided by the patient.  Urinary Frequency    History reviewed. No pertinent past medical history. There are no active problems to display for this patient.  History reviewed. No pertinent surgical history. OB History   No obstetric history on file.    Home Medications    Prior to Admission medications   Medication Sig Start Date End Date Taking? Authorizing Provider  fluconazole  (DIFLUCAN ) 150 MG tablet Take 1 tablet (150 mg total) by mouth daily. Repeat in 1 week if needed 12/17/22   Stephany Ehrich, MD    Family History No family history on file. Social History Social History   Tobacco Use   Smoking status: Never   Smokeless tobacco: Never  Vaping Use   Vaping status: Never Used  Substance Use Topics   Alcohol use: Yes    Comment: occ   Drug use: No   Allergies   Patient has no known allergies.  Review of Systems Review of Systems  Genitourinary:  Positive for frequency.   Pertinent findings revealed after performing a 14 point review of systems has been noted in the history of present illness.  Physical Exam Vital Signs BP 137/83 (BP Location: Right Arm)   Pulse 85   Temp 99.1 F (37.3 C) (Oral)   Resp 16   LMP 10/08/2023   SpO2 96%   No data  found.  Physical Exam Vitals and nursing note reviewed.  Constitutional:      General: She is not in acute distress.    Appearance: Normal appearance. She is not ill-appearing.  HENT:     Head: Normocephalic and atraumatic.  Eyes:     General: Lids are normal.        Right eye: No discharge.        Left eye: No discharge.     Extraocular Movements: Extraocular movements intact.     Conjunctiva/sclera: Conjunctivae normal.     Right eye: Right conjunctiva is not injected.     Left eye: Left conjunctiva is not injected.  Neck:     Trachea: Trachea and phonation normal.  Cardiovascular:     Rate and Rhythm: Normal rate and regular rhythm.     Pulses: Normal pulses.     Heart sounds: Normal heart sounds. No murmur heard.    No friction rub. No gallop.  Pulmonary:     Effort: Pulmonary effort is normal. No accessory muscle usage, prolonged expiration or respiratory distress.     Breath sounds: Normal breath sounds. No stridor, decreased air movement or transmitted upper airway sounds. No decreased breath sounds, wheezing, rhonchi or rales.  Chest:     Chest wall: No tenderness.  Abdominal:     General: Abdomen is flat. Bowel sounds are normal. There is no distension.  Palpations: Abdomen is soft.     Tenderness: There is no abdominal tenderness. There is no right CVA tenderness or left CVA tenderness.     Hernia: No hernia is present.  Musculoskeletal:        General: Normal range of motion.     Cervical back: Normal range of motion and neck supple. Normal range of motion.     Lumbar back: No swelling, spasms, tenderness or bony tenderness.  Lymphadenopathy:     Cervical: No cervical adenopathy.  Skin:    General: Skin is warm and dry.     Findings: No erythema or rash.  Neurological:     General: No focal deficit present.     Mental Status: She is alert and oriented to person, place, and time.  Psychiatric:        Mood and Affect: Mood normal.        Behavior: Behavior  normal.     Visual Acuity Right Eye Distance:   Left Eye Distance:   Bilateral Distance:    Right Eye Near:   Left Eye Near:    Bilateral Near:     UC Couse / Diagnostics / Procedures:     Radiology No results found.  Procedures Procedures (including critical care time) EKG  Pending results:  Labs Reviewed  POCT URINALYSIS DIP (MANUAL ENTRY) - Abnormal; Notable for the following components:      Result Value   Color, UA light yellow (*)    Clarity, UA cloudy (*)    Blood, UA trace-intact (*)    All other components within normal limits  URINE CULTURE  POCT URINE PREGNANCY    Medications Ordered in UC: Medications - No data to display  UC Diagnoses / Final Clinical Impressions(s)   I have reviewed the triage vital signs and the nursing notes.  Pertinent labs & imaging results that were available during my care of the patient were reviewed by me and considered in my medical decision making (see chart for details).    Final diagnoses:  Increased frequency of urination   Urine dip today revealed microscopic hematuria and cloudy appearance.  Urine culture will be performed per our protocol.   Patient advised that they will be contacted with results of the urine culture and that treatment will be provided as indicated based on results. Return precautions advised.  Please see discharge instructions below for details of plan of care as provided to patient. ED Prescriptions   None    PDMP not reviewed this encounter.  Disposition Upon Discharge:  Condition: stable for discharge home  Patient presented with concern for an acute illness with associated systemic symptoms and significant discomfort requiring urgent management. In my opinion, this is a condition that a prudent lay person (someone who possesses an average knowledge of health and medicine) may potentially expect to result in complications if not addressed urgently such as respiratory distress, impairment  of bodily function or dysfunction of bodily organs.   As such, the patient has been evaluated and assessed, work-up was performed and treatment was provided in alignment with urgent care protocols and evidence based medicine.  Patient/parent/caregiver has been advised that the patient may require follow up for further testing and/or treatment if the symptoms continue in spite of treatment, as clinically indicated and appropriate.  Routine symptom specific, illness specific and/or disease specific instructions were discussed with the patient and/or caregiver at length.  Prevention strategies for avoiding STD exposure were also discussed.  The patient will  follow up with their current PCP if and as advised. If the patient does not currently have a PCP we will assist them in obtaining one.   The patient may need specialty follow up if the symptoms continue, in spite of conservative treatment and management, for further workup, evaluation, consultation and treatment as clinically indicated and appropriate.  Patient/parent/caregiver verbalized understanding and agreement of plan as discussed.  All questions were addressed during visit.  Please see discharge instructions below for further details of plan.    Discharge Instructions      The urinalysis that we performed in the clinic today was abnormal.  Urine culture will be performed per our protocol.  The result of the urine culture will be available in the next 3 to 5 days and will be posted to your MyChart account.  If there is an abnormal finding, you will be contacted by phone and advised of further treatment recommendations, if any.  If you have not had complete resolution of your symptoms after completing any treatment that might be prescribed, please return to urgent care for repeat evaluation or follow-up with your primary care provider.  Repeat urinalysis and urine culture may be indicated for more directed therapy.   Thank you for visiting  [facility] today.  We appreciate the opportunity to participate in your care.     This office note has been dictated using Teaching laboratory technician.  Unfortunately, this method of dictation can sometimes lead to typographical or grammatical errors.  I apologize for your inconvenience in advance if this occurs.  Please do not hesitate to reach out to me if clarification is needed.       Eloise Hake Scales, PA-C 10/26/23 1840

## 2023-10-27 LAB — URINE CULTURE

## 2023-10-29 ENCOUNTER — Ambulatory Visit
Admission: EM | Admit: 2023-10-29 | Discharge: 2023-10-29 | Disposition: A | Discharge status: Home / Self Care | Attending: Emergency Medicine | Admitting: Emergency Medicine

## 2023-10-29 ENCOUNTER — Ambulatory Visit: Payer: Self-pay | Admitting: Emergency Medicine

## 2023-10-29 DIAGNOSIS — N3001 Acute cystitis with hematuria: Secondary | ICD-10-CM | POA: Diagnosis not present

## 2023-10-29 DIAGNOSIS — R350 Frequency of micturition: Secondary | ICD-10-CM

## 2023-10-29 LAB — POCT URINALYSIS DIP (MANUAL ENTRY)
Bilirubin, UA: NEGATIVE
Glucose, UA: NEGATIVE mg/dL
Ketones, POC UA: NEGATIVE mg/dL
Leukocytes, UA: NEGATIVE
Nitrite, UA: NEGATIVE
Spec Grav, UA: 1.02 (ref 1.010–1.025)
Urobilinogen, UA: 0.2 U/dL
pH, UA: 8.5 — AB (ref 5.0–8.0)

## 2023-10-29 LAB — POCT URINE PREGNANCY: Preg Test, Ur: NEGATIVE

## 2023-10-29 MED ORDER — SULFAMETHOXAZOLE-TRIMETHOPRIM 800-160 MG PO TABS: 1.0000 | ORAL_TABLET | Freq: Two times a day (BID) | ORAL | 0 refills | Status: AC

## 2023-10-29 NOTE — ED Triage Notes (Addendum)
 Pt states she has still been having urinary frequency. The frequency has decreased but is still present. The pt is here for a lab recollection for continuation of care as instructed to do by the clinical team via phone call today.

## 2023-10-29 NOTE — Progress Notes (Signed)
 Please advise patient that her urine culture was inconclusive.  There were multiple species of bacteria present but none concerning for being the underlying cause of the urinary tract infection.  If patient is feeling better, no further evaluation is needed but if patient's symptoms have persisted or gotten worse, please ask her to return for repeat urinalysis.  This can be a nurse visit.  Thank you very much.

## 2023-10-29 NOTE — Discharge Instructions (Signed)
 Take the full antibiotic course  Take an antibiotic, have yogurt, and use Imodium as needed for diarrhea

## 2023-10-31 ENCOUNTER — Telehealth: Payer: Self-pay

## 2023-10-31 ENCOUNTER — Ambulatory Visit: Payer: Self-pay | Admitting: Emergency Medicine

## 2023-10-31 LAB — URINE CULTURE: Culture: <10000 — AB

## 2023-10-31 NOTE — Telephone Encounter (Signed)
 Pt was identified with three pt identifiers. Pt was informed that was not a significant bacterial growth therefore she did not need further abx treatment. She was told if she continued to have sx to be seen for a vaginal swab. Pt verbalized understanding and states they no longer had sx.

## 2023-10-31 NOTE — Progress Notes (Signed)
 Good morning.  Please advise patient that the culture performed on the second collection of her urine did not reveal any significant growth and no further antibiotics are needed.  For this reason, I believe that the Bactrim  that was prescribed to her at her original visit has been sufficient to resolve any infectious causes of her urinary discomfort.  If she is continuing to have symptoms, I recommend that she return for repeat evaluation which may involve vaginal swab to look for other causes such as BV or yeast.  Thank you.

## 2023-11-28 ENCOUNTER — Emergency Department (HOSPITAL_BASED_OUTPATIENT_CLINIC_OR_DEPARTMENT_OTHER)
Admission: EM | Admit: 2023-11-28 | Discharge: 2023-11-28 | Disposition: A | Discharge status: Home / Self Care | Attending: Emergency Medicine | Admitting: Emergency Medicine

## 2023-11-28 ENCOUNTER — Encounter (HOSPITAL_BASED_OUTPATIENT_CLINIC_OR_DEPARTMENT_OTHER): Payer: Self-pay

## 2023-11-28 ENCOUNTER — Other Ambulatory Visit: Payer: Self-pay

## 2023-11-28 DIAGNOSIS — N3001 Acute cystitis with hematuria: Secondary | ICD-10-CM | POA: Diagnosis not present

## 2023-11-28 DIAGNOSIS — R300 Dysuria: Secondary | ICD-10-CM | POA: Diagnosis present

## 2023-11-28 LAB — URINALYSIS, ROUTINE W REFLEX MICROSCOPIC
Bilirubin Urine: NEGATIVE
Glucose, UA: NEGATIVE mg/dL
Ketones, ur: NEGATIVE mg/dL
Nitrite: NEGATIVE
Protein, ur: >=300 mg/dL — AB
Specific Gravity, Urine: 1.025 (ref 1.005–1.030)
pH: 6 (ref 5.0–8.0)

## 2023-11-28 LAB — URINALYSIS, MICROSCOPIC (REFLEX): WBC, UA: >50 WBC/hpf (ref 0–5)

## 2023-11-28 LAB — PREGNANCY, URINE: Preg Test, Ur: NEGATIVE

## 2023-11-28 MED ORDER — CEPHALEXIN 500 MG PO CAPS: 500.0000 mg | ORAL_CAPSULE | Freq: Three times a day (TID) | ORAL | 0 refills | Status: DC

## 2023-11-28 MED ORDER — CEPHALEXIN 250 MG PO CAPS
500.0000 mg | ORAL_CAPSULE | Freq: Once | ORAL | Status: AC
  Administered 2023-11-28: 500 mg via ORAL
  Filled 2023-11-28: qty 2

## 2023-11-28 NOTE — ED Triage Notes (Signed)
 Woke up ~2am with urgency, burning and noticed small amounts of blood when wiping.

## 2023-11-28 NOTE — ED Provider Notes (Signed)
  [facility] Provider Note   CSN: [id number] Arrival date & time: 11/28/23  9460     Patient presents with: Dysuria   Candace Gonzales is a 23 y.o. female.   Patient is a 23 year old female presenting with complaints of dysuria.  Symptoms started in the night and associated with small amounts of blood on the toilet paper when she wiped.  She denies any back or abdominal pain.  No fevers or chills.  Her last menstrual period was last month and she is due in the next week, but this does not seem like her period.  No fevers or chills.       Prior to Admission medications   Medication Sig Start Date End Date Taking? Authorizing Provider  norgestrel-ethinyl estradiol (LO/OVRAL) 0.3-30 MG-MCG tablet Take 1 tablet by mouth daily.    [provider]    Allergies: Patient has no known allergies.    Review of Systems  All other systems reviewed and are negative.   Updated Vital Signs BP 137/81 (BP Location: Right Arm)   Pulse 86   Temp 98.2 F (36.8 C)   Resp 18   Ht 5' (1.524 m)   Wt 83.9 kg   LMP 11/07/2023 (Approximate)   SpO2 97%   BMI 36.13 kg/m   Physical Exam Vitals and nursing note reviewed.  Constitutional:      General: She is not in acute distress.    Appearance: She is well-developed. She is not diaphoretic.  HENT:     Head: Normocephalic and atraumatic.  Cardiovascular:     Rate and Rhythm: Normal rate and regular rhythm.     Heart sounds: No murmur heard.    No friction rub. No gallop.  Pulmonary:     Effort: Pulmonary effort is normal. No respiratory distress.     Breath sounds: Normal breath sounds. No wheezing.  Abdominal:     General: Bowel sounds are normal. There is no distension.     Palpations: Abdomen is soft.     Tenderness: There is no abdominal tenderness.  Musculoskeletal:        General: Normal range of motion.     Cervical back: Normal range of motion and neck supple.  Skin:     General: Skin is warm and dry.  Neurological:     General: No focal deficit present.     Mental Status: She is alert and oriented to person, place, and time.     (all labs ordered are listed, but only abnormal results are displayed) Labs Reviewed  URINALYSIS, ROUTINE W REFLEX MICROSCOPIC  PREGNANCY, URINE    EKG: None  Radiology: No results found.   Procedures   Medications Ordered in the ED - No data to display                                  Medical Decision Making Amount and/or Complexity of Data Reviewed Labs: ordered.  Risk Prescription drug management.   Urinalysis consistent with UTI.  Will treat with Keflex  and follow-up as needed.     Final diagnoses:  None    ED Discharge Orders     None          Geroldine Berg, MD 11/28/23 [phone]

## 2023-11-28 NOTE — Discharge Instructions (Signed)
 Begin taking Keflex  as prescribed this morning.  Drink plenty of fluids.  Follow-up with primary doctor if not improving in the next few days.

## 2023-11-28 NOTE — ED Notes (Signed)
 Discharge instructions reviewed.   Newly prescribed medications discussed. Pharmacy verified.   Opportunity for questions and concerns provided.   Alert, oriented and ambulatory. Displays no signs of distress.

## 2023-12-09 DIAGNOSIS — Z124 Encounter for screening for malignant neoplasm of cervix: Secondary | ICD-10-CM | POA: Diagnosis not present

## 2023-12-09 DIAGNOSIS — E282 Polycystic ovarian syndrome: Secondary | ICD-10-CM | POA: Diagnosis not present

## 2023-12-09 DIAGNOSIS — Z01419 Encounter for gynecological examination (general) (routine) without abnormal findings: Secondary | ICD-10-CM | POA: Diagnosis not present

## 2024-02-27 ENCOUNTER — Ambulatory Visit
Admission: RE | Admit: 2024-02-27 | Discharge: 2024-02-27 | Disposition: A | Discharge status: Home / Self Care | Attending: Internal Medicine | Admitting: Internal Medicine

## 2024-02-27 ENCOUNTER — Ambulatory Visit

## 2024-02-27 ENCOUNTER — Other Ambulatory Visit: Payer: Self-pay

## 2024-02-27 VITALS — BP 111/79 | HR 88 | Temp 98.3°F | Resp 16

## 2024-02-27 DIAGNOSIS — N898 Other specified noninflammatory disorders of vagina: Secondary | ICD-10-CM | POA: Diagnosis not present

## 2024-02-27 HISTORY — DX: Polycystic ovarian syndrome: E28.2

## 2024-02-27 MED ORDER — FLUCONAZOLE 150 MG PO TABS: 150.0000 mg | ORAL_TABLET | ORAL | 0 refills | Status: AC

## 2024-02-27 NOTE — ED Triage Notes (Signed)
 Patient states  I think I have a yeast infection Onset of discharge and odor for three days. Patient states hx of yeast infections.

## 2024-02-27 NOTE — ED Provider Notes (Signed)
 [facility]  Note:  This document was prepared using Dragon voice recognition software and may include unintentional dictation errors.  MRN: [id number] DOB: 2001/05/23 DATE: 02/27/24   Subjective:  Chief Complaint:  Chief Complaint  Patient presents with   Vaginal Discharge    Yeast - Entered by patient     HPI: Candace Gonzales is a 23 y.o. female presenting for vaginal discharge and odor for 3 days. Patient reports concern for yeast infection. Patient states symptoms are similar to yeast infections that she has had in the past. Reports yeasty odor. No known STD exposures. No chance of pregnancy. Patient reports trying OTC vagisil with no relief. Denies fever, nausea/vomiting, abdominal pain dysuria, vaginal lesions. Endorses vaginal odor, vaginal discharge. Presents NAD.  Prior to Admission medications   Not on File     No Known Allergies  History:   Past Medical History:  Diagnosis Date   PCOS (polycystic ovarian syndrome)      History reviewed. No pertinent surgical history.  History reviewed. No pertinent family history.  Social History   Tobacco Use   Smoking status: Never   Smokeless tobacco: Never  Vaping Use   Vaping status: Never Used  Substance Use Topics   Alcohol use: Not Currently    Comment: occ   Drug use: No    Review of Systems  Constitutional:  Negative for fever.  Gastrointestinal:  Negative for abdominal pain, nausea and vomiting.  Genitourinary:  Positive for vaginal discharge. Negative for dysuria, genital sores and hematuria.  Musculoskeletal:  Negative for back pain.     Objective:   Vitals: BP 111/79 (BP Location: Right Arm)   Pulse 88   Temp 98.3 F (36.8 C) (Oral)   Resp 16   LMP 02/10/2024   SpO2 95%   Physical Exam Constitutional:      General: She is not in acute distress.    Appearance: Normal appearance. She is well-developed. She is obese. She is not ill-appearing or toxic-appearing.  HENT:     Head:  Normocephalic and atraumatic.  Cardiovascular:     Rate and Rhythm: Normal rate and regular rhythm.     Heart sounds: Normal heart sounds.  Pulmonary:     Effort: Pulmonary effort is normal.     Breath sounds: Normal breath sounds.     Comments: Clear to auscultation bilaterally  Abdominal:     General: Bowel sounds are normal.     Palpations: Abdomen is soft.     Tenderness: There is no abdominal tenderness. There is no right CVA tenderness or left CVA tenderness.  Skin:    General: Skin is warm and dry.  Neurological:     General: No focal deficit present.     Mental Status: She is alert.  Psychiatric:        Mood and Affect: Mood and affect normal.     Results:  Labs: No results found for this or any previous visit (from the past 24 hours).  Radiology: No results found.   UC Course/Treatments:  Procedures: Procedures   Medications Ordered in UC: Medications - No data to display   Assessment and Plan :     ICD-10-CM   1. Vaginal discharge  N89.8     2. Vaginal odor  N89.8      Vaginal discharge Vaginal odor Afebrile, nontoxic-appearing, NAD. VSS. DDX includes but not limited to: BV, yeast, STD Cytology is pending.  Given patient's report of yeasty odor as well as similar symptoms  in the past with yeast infections, Diflucan  150 mg every 7 2 hours was prescribed.  Will adjust treatment plan based on cytology results. Safe sex precautions advised. Strict ED precautions were given and patient verbalized understanding.  ED Discharge Orders          Ordered    fluconazole  (DIFLUCAN ) 150 MG tablet  every 72 hours        02/27/24 1541             PDMP not reviewed this encounter.     Basilia Ulanda SQUIBB, PA-C 02/27/24 1546

## 2024-02-27 NOTE — Discharge Instructions (Addendum)
Your swab was sent to the lab for further testing.  You will be called with results. Diflucan is a prescription given to treat yeast infections. Take the prescription as directed.  You should avoid all sexual activity until you have been notified of all your results and have undergone any necessary treatment.  If you are positive, it is recommended that you inform all sexual partners so they can treat be treated as well before having sex again.

## 2024-02-28 LAB — CERVICOVAGINAL ANCILLARY ONLY
Bacterial Vaginitis (gardnerella): NEGATIVE
Candida Glabrata: NEGATIVE
Candida Vaginitis: NEGATIVE
Chlamydia: NEGATIVE
Comment: NEGATIVE
Comment: NEGATIVE
Comment: NEGATIVE
Comment: NEGATIVE
Comment: NEGATIVE
Comment: NORMAL
Neisseria Gonorrhea: NEGATIVE
Trichomonas: NEGATIVE
